# Patient Record
Sex: Female | Born: 1940 | Race: Asian | Hispanic: No | Smoking: Never smoker
Health system: Southern US, Community
[De-identification: ages and names within clinical notes are randomized; demographics above are authoritative.]

---

## 2006-12-01 ENCOUNTER — Telehealth (INDEPENDENT_AMBULATORY_CARE_PROVIDER_SITE_OTHER): Payer: Self-pay | Admitting: Nurse Practitioner

## 2006-12-08 ENCOUNTER — Encounter (INDEPENDENT_AMBULATORY_CARE_PROVIDER_SITE_OTHER): Payer: Self-pay | Admitting: Internal Medicine

## 2006-12-08 ENCOUNTER — Ambulatory Visit: Payer: Self-pay | Admitting: Nurse Practitioner

## 2006-12-08 DIAGNOSIS — R413 Other amnesia: Secondary | ICD-10-CM | POA: Insufficient documentation

## 2006-12-08 DIAGNOSIS — R519 Headache, unspecified: Secondary | ICD-10-CM | POA: Insufficient documentation

## 2006-12-08 DIAGNOSIS — F8189 Other developmental disorders of scholastic skills: Secondary | ICD-10-CM

## 2006-12-08 DIAGNOSIS — R51 Headache: Secondary | ICD-10-CM

## 2006-12-08 LAB — CONVERTED CEMR LAB
AST: 16 units/L (ref 0–37)
Albumin: 4.6 g/dL (ref 3.5–5.2)
Alkaline Phosphatase: 73 units/L (ref 39–117)
Basophils Relative: 0 % (ref 0–1)
Eosinophils Absolute: 0.1 10*3/uL — ABNORMAL LOW (ref 0.2–0.7)
Glucose, Urine, Semiquant: NEGATIVE
HDL: 44 mg/dL (ref >39)
LDL Cholesterol: 84 mg/dL (ref 0–99)
MCHC: 33.1 g/dL (ref 30.0–36.0)
MCV: 85.6 fL (ref 78.0–100.0)
Neutrophils Relative %: 70 % (ref 43–77)
Platelets: 233 10*3/uL (ref 150–400)
Potassium: 3.8 meq/L (ref 3.5–5.3)
Sodium: 145 meq/L (ref 135–145)
TSH: 0.765 microintl units/mL (ref 0.350–5.50)
Total Protein: 7.6 g/dL (ref 6.0–8.3)
Urobilinogen, UA: NEGATIVE
WBC Urine, dipstick: NEGATIVE

## 2006-12-09 ENCOUNTER — Encounter (INDEPENDENT_AMBULATORY_CARE_PROVIDER_SITE_OTHER): Payer: Self-pay | Admitting: Nurse Practitioner

## 2007-03-30 ENCOUNTER — Other Ambulatory Visit: Admission: RE | Admit: 2007-03-30 | Discharge: 2007-03-30 | Payer: Self-pay | Admitting: Family Medicine

## 2007-03-30 ENCOUNTER — Ambulatory Visit: Payer: Self-pay | Admitting: Nurse Practitioner

## 2007-03-30 DIAGNOSIS — F4310 Post-traumatic stress disorder, unspecified: Secondary | ICD-10-CM

## 2007-03-30 DIAGNOSIS — R3129 Other microscopic hematuria: Secondary | ICD-10-CM

## 2007-03-30 LAB — CONVERTED CEMR LAB
Chlamydia, DNA Probe: NEGATIVE
Glucose, Urine, Semiquant: NEGATIVE
Urobilinogen, UA: 0.2
WBC Urine, dipstick: NEGATIVE

## 2007-03-31 ENCOUNTER — Encounter (INDEPENDENT_AMBULATORY_CARE_PROVIDER_SITE_OTHER): Payer: Self-pay | Admitting: Nurse Practitioner

## 2007-04-02 ENCOUNTER — Ambulatory Visit (HOSPITAL_COMMUNITY): Admission: RE | Admit: 2007-04-02 | Discharge: 2007-04-02 | Payer: Self-pay | Admitting: Family Medicine

## 2007-04-30 ENCOUNTER — Ambulatory Visit: Payer: Self-pay | Admitting: Nurse Practitioner

## 2007-05-08 ENCOUNTER — Ambulatory Visit (HOSPITAL_COMMUNITY): Admission: RE | Admit: 2007-05-08 | Discharge: 2007-05-08 | Payer: Self-pay | Admitting: Internal Medicine

## 2008-03-10 ENCOUNTER — Emergency Department (HOSPITAL_COMMUNITY): Admission: EM | Admit: 2008-03-10 | Discharge: 2008-03-10 | Payer: Self-pay | Admitting: Emergency Medicine

## 2008-03-13 ENCOUNTER — Emergency Department (HOSPITAL_COMMUNITY): Admission: EM | Admit: 2008-03-13 | Discharge: 2008-03-13 | Payer: Self-pay | Admitting: Emergency Medicine

## 2010-02-11 ENCOUNTER — Encounter: Payer: Self-pay | Admitting: Family Medicine

## 2010-05-08 LAB — URINALYSIS, ROUTINE W REFLEX MICROSCOPIC
Glucose, UA: 100 mg/dL — AB
Glucose, UA: NEGATIVE mg/dL
Ketones, ur: NEGATIVE mg/dL
Protein, ur: NEGATIVE mg/dL
Protein, ur: NEGATIVE mg/dL
Urobilinogen, UA: 0.2 mg/dL (ref 0.0–1.0)
pH: 7.5 (ref 5.0–8.0)

## 2010-05-08 LAB — CBC
HCT: 37.4 % (ref 36.0–46.0)
Hemoglobin: 12.5 g/dL (ref 12.0–15.0)
Hemoglobin: 13.4 g/dL (ref 12.0–15.0)
MCHC: 33.3 g/dL (ref 30.0–36.0)
MCV: 85 fL (ref 78.0–100.0)
RBC: 4.4 MIL/uL (ref 3.87–5.11)
RBC: 4.78 MIL/uL (ref 3.87–5.11)
RDW: 13.7 % (ref 11.5–15.5)
WBC: 11.9 10*3/uL — ABNORMAL HIGH (ref 4.0–10.5)

## 2010-05-08 LAB — POCT I-STAT, CHEM 8
BUN: 14 mg/dL (ref 6–23)
Calcium, Ion: 1.07 mmol/L — ABNORMAL LOW (ref 1.12–1.32)
Creatinine, Ser: 1.1 mg/dL (ref 0.4–1.2)
Hemoglobin: 14.3 g/dL (ref 12.0–15.0)
Sodium: 139 mEq/L (ref 135–145)
TCO2: 25 mmol/L (ref 0–100)

## 2010-05-08 LAB — COMPREHENSIVE METABOLIC PANEL
ALT: 18 U/L (ref 0–35)
AST: 30 U/L (ref 0–37)
Alkaline Phosphatase: 83 U/L (ref 39–117)
CO2: 26 mEq/L (ref 19–32)
Chloride: 101 mEq/L (ref 96–112)
GFR calc Af Amer: 59 mL/min — ABNORMAL LOW (ref >60)
GFR calc non Af Amer: 49 mL/min — ABNORMAL LOW (ref >60)
Glucose, Bld: 173 mg/dL — ABNORMAL HIGH (ref 70–99)
Potassium: 4.1 mEq/L (ref 3.5–5.1)
Sodium: 136 mEq/L (ref 135–145)

## 2010-05-08 LAB — BASIC METABOLIC PANEL
CO2: 27 mEq/L (ref 19–32)
Chloride: 105 mEq/L (ref 96–112)
GFR calc Af Amer: >60 mL/min (ref >60)
Glucose, Bld: 116 mg/dL — ABNORMAL HIGH (ref 70–99)
Sodium: 138 mEq/L (ref 135–145)

## 2010-05-08 LAB — DIFFERENTIAL
Basophils Absolute: 0 10*3/uL (ref 0.0–0.1)
Basophils Relative: 0 % (ref 0–1)
Basophils Relative: 0 % (ref 0–1)
Eosinophils Absolute: 0 10*3/uL (ref 0.0–0.7)
Eosinophils Absolute: 0.1 10*3/uL (ref 0.0–0.7)
Eosinophils Relative: 0 % (ref 0–5)
Eosinophils Relative: 1 % (ref 0–5)
Monocytes Absolute: 0.6 10*3/uL (ref 0.1–1.0)
Monocytes Relative: 7 % (ref 3–12)
Neutro Abs: 7.5 10*3/uL (ref 1.7–7.7)
Neutrophils Relative %: 92 % — ABNORMAL HIGH (ref 43–77)

## 2010-05-08 LAB — URINE MICROSCOPIC-ADD ON

## 2010-05-08 LAB — LIPASE, BLOOD: Lipase: 19 U/L (ref 11–59)

## 2010-05-11 ENCOUNTER — Emergency Department (HOSPITAL_COMMUNITY)
Admission: EM | Admit: 2010-05-11 | Discharge: 2010-05-11 | Disposition: A | Discharge status: Home / Self Care | Payer: Medicaid Other | Attending: Emergency Medicine | Admitting: Emergency Medicine

## 2010-05-11 DIAGNOSIS — W540XXA Bitten by dog, initial encounter: Secondary | ICD-10-CM | POA: Insufficient documentation

## 2010-05-11 DIAGNOSIS — M79609 Pain in unspecified limb: Secondary | ICD-10-CM | POA: Insufficient documentation

## 2010-05-11 DIAGNOSIS — S81009A Unspecified open wound, unspecified knee, initial encounter: Secondary | ICD-10-CM | POA: Insufficient documentation

## 2010-12-17 ENCOUNTER — Emergency Department (INDEPENDENT_AMBULATORY_CARE_PROVIDER_SITE_OTHER)
Admission: EM | Admit: 2010-12-17 | Discharge: 2010-12-17 | Disposition: A | Discharge status: Home / Self Care | Payer: Medicare Other | Source: Home / Self Care | Attending: Emergency Medicine | Admitting: Emergency Medicine

## 2010-12-17 DIAGNOSIS — H9209 Otalgia, unspecified ear: Secondary | ICD-10-CM

## 2010-12-17 DIAGNOSIS — H9190 Unspecified hearing loss, unspecified ear: Secondary | ICD-10-CM

## 2010-12-17 DIAGNOSIS — H9203 Otalgia, bilateral: Secondary | ICD-10-CM

## 2010-12-17 MED ORDER — CETIRIZINE-PSEUDOEPHEDRINE ER 5-120 MG PO TB12: 1.0000 | ORAL_TABLET | Freq: Every day | ORAL | Status: AC

## 2010-12-17 NOTE — ED Notes (Signed)
3 week hx of bilateral earache.  traveled to [place] 3 weeks ago, then started having ear pain.

## 2010-12-17 NOTE — ED Provider Notes (Signed)
History     CSN: [id number] Arrival date & time: 12/17/2010 10:57 AM   First MD Initiated Contact with Patient 12/17/10 1020      Chief Complaint  Patient presents with  . Otalgia    3 week hx of bilateral earache.  traveled to [place] 3 weeks ago, then started having ear pain.      (Consider location/radiation/quality/duration/timing/severity/associated sxs/prior treatment) HPI Comments: B/l hearing loss x 3 weeks- post-trip to [place] denies cold like symptoms or colds prior and post Cough x 2 days.No fevers. Can't hear out of both ears after 3-4 days since her return".   Patient is a 70 y.o. female presenting with ear pain. The history is provided by the patient.  Otalgia This is a new problem. The current episode started more than 1 week ago. There is pain in both ears. The problem occurs constantly. The problem has not changed since onset.There has been no fever. The patient is experiencing no pain. Associated symptoms include hearing loss. Pertinent negatives include no ear discharge, no headaches, no sore throat, no abdominal pain, no vomiting, no neck pain, no cough and no rash. Her past medical history is significant for hearing loss. Her past medical history does not include chronic ear infection or tympanostomy tube.    History reviewed. No pertinent past medical history.  History reviewed. No pertinent past surgical history.  History reviewed. No pertinent family history.  History  Substance Use Topics  . Smoking status: Never Smoker   . Smokeless tobacco: Never Used  . Alcohol Use: No    OB History    Grav Para Term Preterm Abortions TAB SAB Ect Mult Living                  Review of Systems  HENT: Positive for hearing loss and ear pain. Negative for sore throat, neck pain and ear discharge.   Respiratory: Negative for cough.   Gastrointestinal: Negative for vomiting and abdominal pain.  Skin: Negative for rash.  Neurological: Negative for headaches.     Allergies  Review of patient's allergies indicates no known allergies.  Home Medications  No current outpatient prescriptions on file.  BP 146/85  Pulse 75  Temp(Src) 98.7 F (37.1 C) (Oral)  Resp 16  SpO2 100%  Physical Exam  ED Course  Procedures (including critical care time)  Labs Reviewed - No data to display No results found.   No diagnosis found.    MDM  B/l hearing loss x 3 weeks- opts-trip to [place] denies cold like symptoms or colds prior and post Cough x 2 days.No fevers        Jimmie Molly, MD 12/17/10 [phone]

## 2011-01-01 IMAGING — CR DG ABDOMEN 1V
1 series · 1 of 1 positions shown · non-contrast
Comparison: CT from 03/10/2008

CLINICAL DATA: Flank pain.  Known 3 mm distal left ureteral stone
on the CT scan from 3 days ago.

ABDOMEN - 1 VIEW

[t abdomen supine]
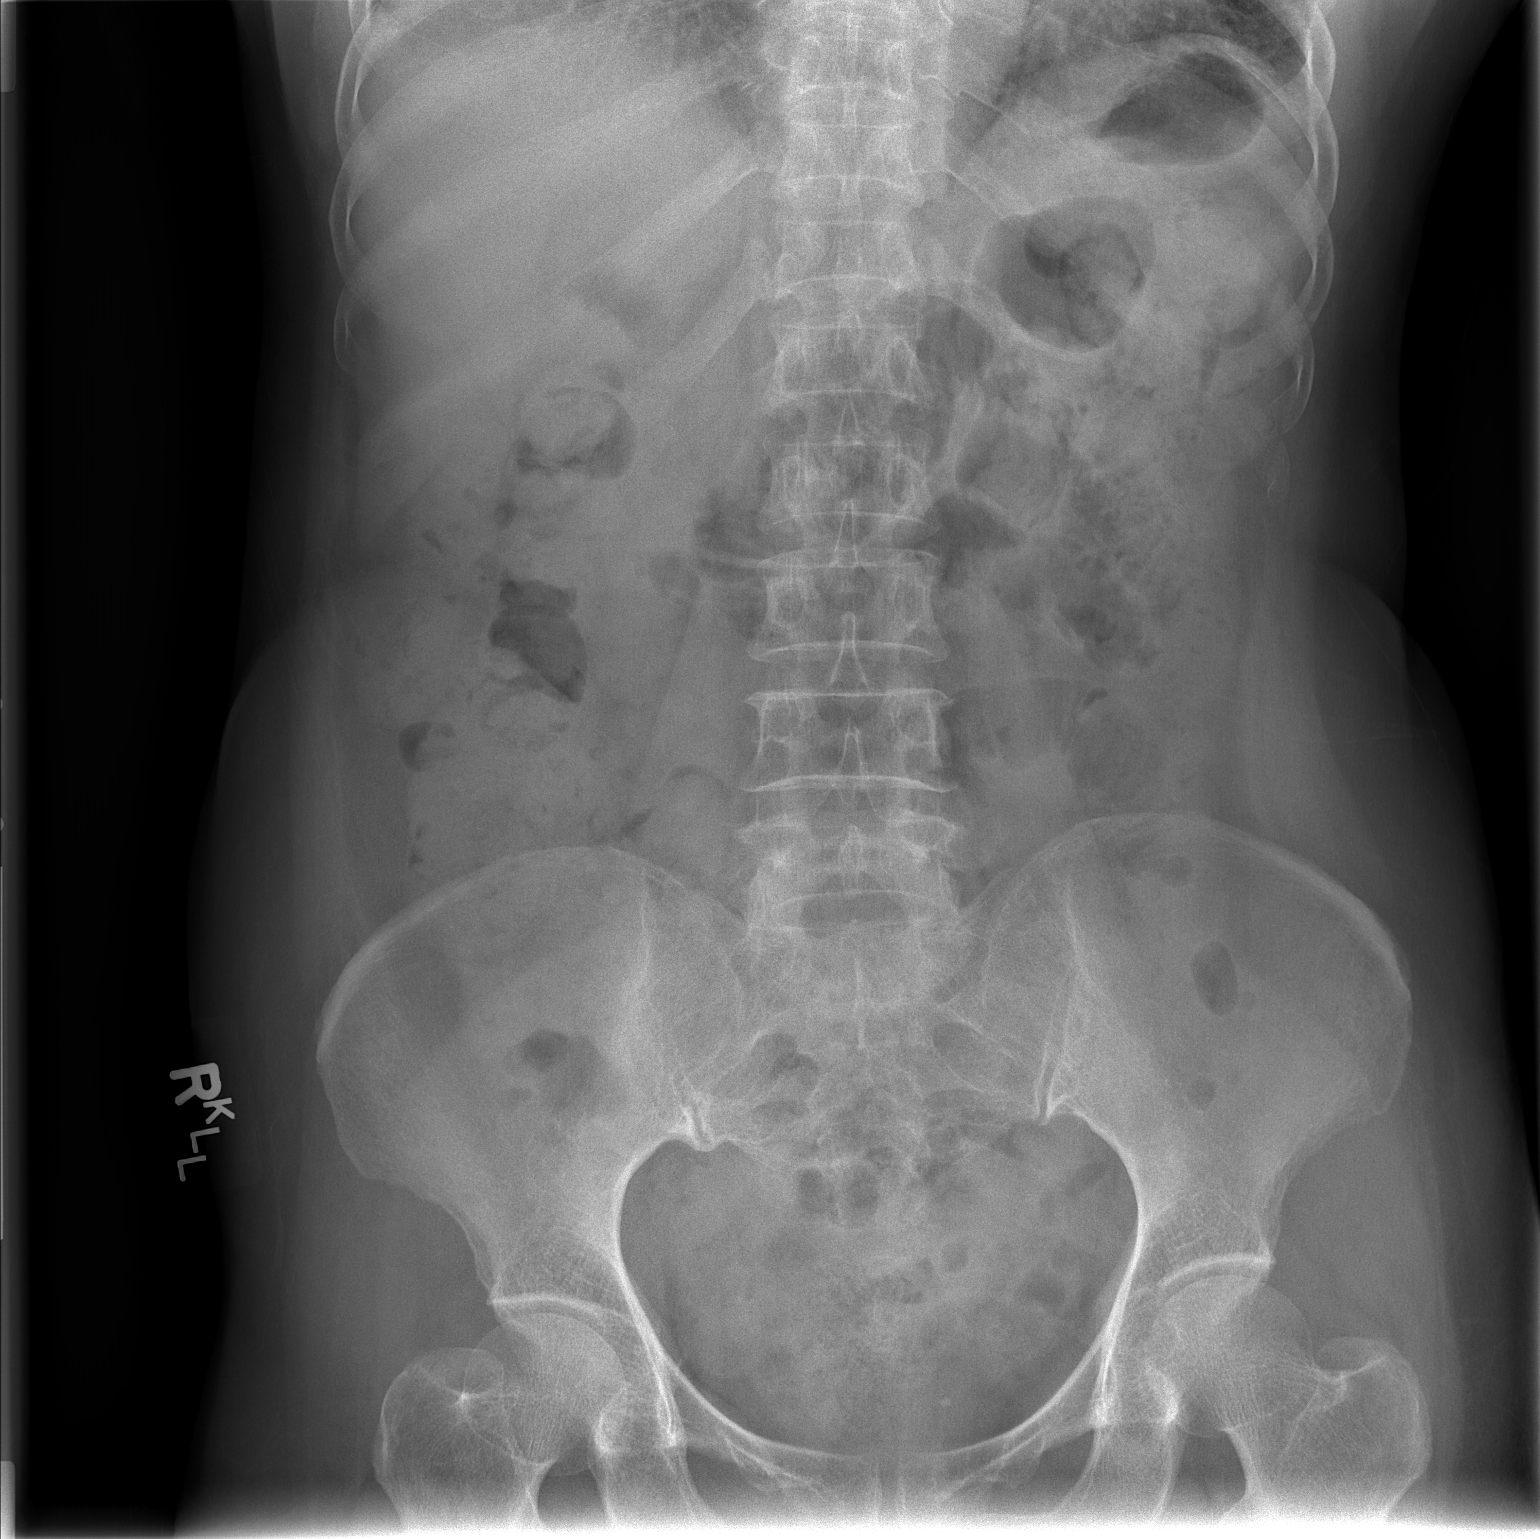

[1 of 1 positions shown; findings below may reference images not displayed]

FINDINGS: The bowel gas pattern is nonspecific.  Bones are
demineralized.  3 mm density in the left inferior anatomic pelvis
is compatible with the distal left ureteral stone as the patient
had no calcified phleboliths within the anatomic pelvis on the
recent CT scan.  The stone remains to the left of midline, and
thus, it is felt to be within or near the left UVJ.
IMPRESSION: Persistence of the distal left ureteral/UVJ stone.

## 2014-05-02 DIAGNOSIS — E119 Type 2 diabetes mellitus without complications: Secondary | ICD-10-CM | POA: Diagnosis not present

## 2014-05-02 DIAGNOSIS — I10 Essential (primary) hypertension: Secondary | ICD-10-CM | POA: Diagnosis not present

## 2016-03-12 ENCOUNTER — Encounter (HOSPITAL_COMMUNITY): Payer: Self-pay | Admitting: Emergency Medicine

## 2016-03-12 ENCOUNTER — Ambulatory Visit (HOSPITAL_COMMUNITY)
Admission: EM | Admit: 2016-03-12 | Discharge: 2016-03-12 | Disposition: A | Discharge status: Home / Self Care | Payer: Medicare Other | Attending: Family Medicine | Admitting: Family Medicine

## 2016-03-12 DIAGNOSIS — J209 Acute bronchitis, unspecified: Secondary | ICD-10-CM

## 2016-03-12 MED ORDER — BENZONATATE 100 MG PO CAPS: 100.0000 mg | ORAL_CAPSULE | Freq: Three times a day (TID) | ORAL | 0 refills | Status: DC

## 2016-03-12 MED ORDER — AZITHROMYCIN 250 MG PO TABS: 250.0000 mg | ORAL_TABLET | Freq: Every day | ORAL | 0 refills | Status: DC

## 2016-03-12 NOTE — Discharge Instructions (Signed)
I am treating you for acute bronchitis. I have prescribed azithromycin. Take 2 tablets today, then 1 tablet daily until finished. For cough and prescribed Tessalon, take one tablet every 8 hours as needed for cough. I would encourage you to continue to take over-the-counter Mucinex, twice a day with a full glass of water. Should your symptoms continue, fail to improve, follow up with your primary care physician or return to clinic as needed.

## 2016-03-12 NOTE — ED Triage Notes (Signed)
The patient presented to the [facility] with a complaint of a cough x 5 days. 

## 2016-03-12 NOTE — ED Provider Notes (Signed)
CSN: [id number]     Arrival date & time 03/12/16  1116 History   First MD Initiated Contact with Patient 03/12/16 1314     Chief Complaint  Patient presents with  . Cough   (Consider location/radiation/quality/duration/timing/severity/associated sxs/prior Treatment) 76 year old female patient presents to clinic with chief complaint of cough, and sore throat, for 5 days. Patient denies any fever, nausea, vomiting, diarrhea, abdominal pain, denies muscle aches, headaches, body aches, or congestion. She does not smoke, denies history of chronic lung disease such as asthma, COPD, or emphysema. She has tried over-the-counter Mucinex, without relief.   The history is provided by a relative. The history is limited by a language barrier. No language interpreter was used.  Cough    History reviewed. No pertinent past medical history. History reviewed. No pertinent surgical history. History reviewed. No pertinent family history. Social History  Substance Use Topics  . Smoking status: Never Smoker  . Smokeless tobacco: Never Used  . Alcohol use No   OB History    No data available     Review of Systems  Reason unable to perform ROS: as covered in HPI.  Respiratory: Positive for cough.   All other systems reviewed and are negative.   Allergies  Patient has no known allergies.  Home Medications   Prior to Admission medications   Medication Sig Start Date End Date Taking? Authorizing Provider  azithromycin (ZITHROMAX) 250 MG tablet Take 1 tablet (250 mg total) by mouth daily. Take first 2 tablets together, then 1 every day until finished. 03/12/16   Dorena BodoLawrence Levan Aloia, NP  benzonatate (TESSALON) 100 MG capsule Take 1 capsule (100 mg total) by mouth every 8 (eight) hours. 03/12/16   Dorena BodoLawrence Keeara Frees, NP   Meds Ordered and Administered this Visit  Medications - No data to display  BP 158/67 (BP Location: Right Arm)   Pulse 92   Temp 97.5 F (36.4 C) (Oral)   Resp 18   SpO2 100%  No  data found.   Physical Exam  Constitutional: She is oriented to person, place, and time. She appears well-developed and well-nourished. She does not have a sickly appearance. She does not appear ill. No distress.  HENT:  Head: Normocephalic and atraumatic.  Right Ear: Tympanic membrane and external ear normal.  Left Ear: Tympanic membrane and external ear normal.  Nose: Nose normal. Right sinus exhibits no maxillary sinus tenderness and no frontal sinus tenderness. Left sinus exhibits no maxillary sinus tenderness and no frontal sinus tenderness.  Mouth/Throat: Uvula is midline and oropharynx is clear and moist. No oropharyngeal exudate.  Eyes: Pupils are equal, round, and reactive to light.  Neck: Normal range of motion. Neck supple. No JVD present.  Cardiovascular: Normal rate and regular rhythm.   Pulmonary/Chest: Effort normal and breath sounds normal. No respiratory distress. She has no wheezes. She has no rhonchi. She has no rales.  Abdominal: Soft. Bowel sounds are normal. She exhibits no distension. There is no tenderness. There is no guarding.  Lymphadenopathy:       Head (right side): No submental, no submandibular and no tonsillar adenopathy present.       Head (left side): No submental, no submandibular and no tonsillar adenopathy present.    She has no cervical adenopathy.  Neurological: She is alert and oriented to person, place, and time.  Skin: Skin is warm and dry. Capillary refill takes less than 2 seconds. She is not diaphoretic.  Psychiatric: She has a normal mood and affect.  Nursing  note and vitals reviewed.   Urgent Care Course     Procedures (including critical care time)  Labs Review Labs Reviewed - No data to display  Imaging Review No results found.   Visual Acuity Review  Right Eye Distance:   Left Eye Distance:   Bilateral Distance:    Right Eye Near:   Left Eye Near:    Bilateral Near:         MDM   1. Acute bronchitis, unspecified  organism   I am treating you for acute bronchitis. I have prescribed azithromycin. Take 2 tablets today, then 1 tablet daily until finished. For cough and prescribed Tessalon, take one tablet every 8 hours as needed for cough. I would encourage you to continue to take over-the-counter Mucinex, twice a day with a full glass of water. Should your symptoms continue, fail to improve, follow up with your primary care physician or return to clinic as needed.     Dorena Bodo, NP 03/12/16 1343

## 2016-05-03 ENCOUNTER — Ambulatory Visit (INDEPENDENT_AMBULATORY_CARE_PROVIDER_SITE_OTHER): Payer: Medicare Other | Admitting: Physician Assistant

## 2016-05-06 ENCOUNTER — Ambulatory Visit (INDEPENDENT_AMBULATORY_CARE_PROVIDER_SITE_OTHER): Payer: Medicare Other | Admitting: Physician Assistant

## 2016-05-13 ENCOUNTER — Ambulatory Visit (INDEPENDENT_AMBULATORY_CARE_PROVIDER_SITE_OTHER): Payer: Medicare Other | Admitting: Physician Assistant

## 2016-05-13 ENCOUNTER — Encounter (INDEPENDENT_AMBULATORY_CARE_PROVIDER_SITE_OTHER): Payer: Self-pay | Admitting: Physician Assistant

## 2016-05-13 VITALS — BP 189/94 | HR 70 | Temp 97.4°F | Ht 58.5 in | Wt 104.0 lb

## 2016-05-13 DIAGNOSIS — I10 Essential (primary) hypertension: Secondary | ICD-10-CM

## 2016-05-13 DIAGNOSIS — Z9189 Other specified personal risk factors, not elsewhere classified: Secondary | ICD-10-CM | POA: Diagnosis not present

## 2016-05-13 DIAGNOSIS — H538 Other visual disturbances: Secondary | ICD-10-CM | POA: Diagnosis not present

## 2016-05-13 DIAGNOSIS — Z1211 Encounter for screening for malignant neoplasm of colon: Secondary | ICD-10-CM

## 2016-05-13 DIAGNOSIS — Z23 Encounter for immunization: Secondary | ICD-10-CM | POA: Diagnosis not present

## 2016-05-13 MED ORDER — CLONIDINE HCL 0.1 MG PO TABS
0.2000 mg | ORAL_TABLET | Freq: Once | ORAL | Status: AC
  Administered 2016-05-13: 0.2 mg via ORAL

## 2016-05-13 MED ORDER — PNEUMOCOCCAL VAC POLYVALENT 25 MCG/0.5ML IJ INJ: 0.5000 mL | INJECTION | INTRAMUSCULAR | Status: DC

## 2016-05-13 MED ORDER — LISINOPRIL-HYDROCHLOROTHIAZIDE 20-12.5 MG PO TABS: 1.0000 | ORAL_TABLET | Freq: Every day | ORAL | 1 refills | Status: DC

## 2016-05-13 MED ORDER — ASPIRIN EC 81 MG PO TBEC: 81.0000 mg | DELAYED_RELEASE_TABLET | Freq: Every day | ORAL | 3 refills | Status: DC

## 2016-05-13 MED ORDER — TETANUS-DIPHTH-ACELL PERTUSSIS 5-2.5-18.5 LF-MCG/0.5 IM SUSP: 0.5000 mL | Freq: Once | INTRAMUSCULAR | Status: DC

## 2016-05-13 NOTE — Patient Instructions (Signed)
T?ng huy?t p Hypertension T?ng huy?t p, th??ng ???c g?i l huy?t p cao, l khi l?c b?m mu qua ??ng m?ch c?a qu v? qu m?nh. ??ng m?ch c?a qu v? l cc m?ch mu mang mu t? tim ?i kh?p c? th?. T?ng huy?t p khi?n tim lm vi?c v?t v? h?n ?? b?m mu v c th? khi?n cc ??ng m?ch tr? ln h?p ho?c c?ng. T?ng huy?t p khng ???c ?i?u tr? ho?c khng ki?m sot ???c c th? d?n t?i nh?i mu c? tim, ??t qu?, b?nh th?n v nh?ng v?n ?? khc. Ch? s? ?o huy?t p g?m m?t ch? s? cao trn m?t ch? s? th?p. Huy?t a?p ly? t???ng cu?a quy? vi? la? d??i 120/80. Ch? s? ??u tin ("??nh") ???c g?i l huy?t p tm thu. ?y l s? ?o p su?t trong ??ng m?ch khi tim qu v? ??p. Ch? s? th? hai ("?y") ???c g?i l huy?t p tm tr??ng. ?y l s? ?o p su?t trong ??ng m?ch khi tim qu v? ngh?. Nguyn nhn g gy ra? Khng r nguyn nhn gy ra tnh tr?ng ny. ?i?u g lm t?ng nguy c?? M?t s? y?u t? nguy c? d?n ??n huy?t p cao c th? ki?m sot ???c. M?t s? y?u t? khc th khng. Nh?ng y?u t? qu v? c th? thay ??i   Ht thu?c.  B? b?nh ti?u ???ng tup 2, cholesterol cao, ho?c c? hai.  Khng t?p th? d?c ho?c cc ho?t ??ng th? ch?t ??y ??.  Th?a cn.  ?n qu nhi?u ch?t bo, ???ng, ca-lo, ho?c mu?i (Natri).  U?ng qu nhi?u r??u. Nh?ng y?u t? kh ho?c khng th? thay ??i   B?nh th?n m?n tnh.  C ti?n s? gia ?nh b? cao huy?t p.  ?? tu?i. Nguy c? t?ng ln theo ?? tu?i.  Ch?ng t?c. Qu v? c th? c nguy c? cao h?n n?u qu v? l ng??i M? g?c Phi.  Gi?i tnh. Nam gi?i c nguy c? cao h?n ph? n? tr??c tu?i 45. Sau tu?i 65, ph? n? c nguy c? cao h?n nam gi?i.  Ng?ng th? do t?c ngh?n khi ng?.  C?ng th?ng. Cc d?u hi?u ho?c tri?u ch?ng l g? Huy?t p qu cao (c?n t?ng huy?t p) c th? gy ra:  ?au ??u.  Lo u.  Kh th?.  Ch?y mu cam.  Bu?n nn v nn.  ?au ng?c n?ng.  C? ??ng gi?t gi?t qu v? khng th? ki?m sot ???c (co gi?t). Ch?n ?on tnh tr?ng ny nh? th? no? Tnh tr?ng ny ???c ch?n ?on b?ng  cch ?o huy?t p c?a qu v? lc qu v? ng?i, ?? tay trn m?t m?t ph?ng. B?ng qu?n thi?t b? ?o huy?t p s? ???c qu?n tr?c ti?pvo vng da cnh tay pha trn c?a qu v? ngang v?i m?c tim. Huy?t p c?n ???c ?o t nh?t hai l?n trn cng m?t cnh tay. M?t s? tnh tr?ng nh?t ??nh c th? lm cho huy?t p khc nhau gi?a tay ph?i v tay tri c?a qu v?. M?t s? y?u t? nh?t ??nh c th? khi?n ch? s? ?o huy?t p th?p h?n ho?c cao h?n so v?i bnh th??ng (t?ng) trong th?i gian ng?n:  Khi huy?t p c?a qu v? ? phng khm c?a chuyn gia ch?m sc s?c kh?e cao h?n so v?i lc qu v? ? nh, hi?n t??ng ny ???c g?i l t?ng huy?t p o chong tr?ng. H?u h?t nh?ng ng??i b? tnh tr?ng ny ??u khng c?n dng thu?c.  Khi   huy?t p c?a qu v? lc ? nh cao h?n so v?i lc qu v? ? phng khm chuyn gia ch?m sc s?c kh?e, hi?n t??ng ny ???c g?i l t?ng huy?t p m?t n?. H?u h?t nh?ng ng??i b? tnh tr?ng ny ??u c th? c?n dng thu?c ?? ki?m sot huy?t p. N?u qu v? c ch? s? huy?t p cao trong m?t l?n khm ho?c qu v? c huy?t p bnh th??ng c km cc y?u t? nguy c? khc:  Qu v? c th? ???c yu c?u tr? l?i vo m?t ngy khc ?? ki?m tra l?i huy?t p.  Qu v? c th? ???c yu c?u theo di huy?t p t?i nh trong vng 1 tu?n ho?c lu h?n. N?u qu v? ???c ch?n ?on b? t?ng huy?t p, qu v? c th? c?n th?c hi?n cc xt nghi?m mu ho?c ki?m tra hnh ?nh khc ?? gip chuyn gia ch?m sc s?c kh?e hi?u nguy c? t?ng th? m?c cc b?nh tr?ng khc. Tnh tr?ng ny ???c ?i?u tr? nh? th? no? Tnh tr?ng ny ???c ?i?u tr? b?ng cch thay ??i l?i s?ng lnh m?nh, ch?ng h?nh nh? ?n th?c ph?m c l?i cho s?c kh?e, t?p th? d?c nhi?u h?n v gi?m l??ng r??u u?ng vo. N?u thay ??i l?i s?ng khng ?? ?? ??a huy?t p v? m?c c th? ki?m sot ???c, chuyn gia ch?m sc s?c kh?e c th? k ??n thu?c, v n?u:  Huy?t p tm thu c?a qu v? trn 130.  Huy?t p tm tr??ng c?a qu v? trn 80. Huy?t p m?c tiu c nhn c?a qu v? c th? khc nhau ty thu?c v tnh tr?ng  b?nh l, tu?i v cc nhn t? khc. Tun th? nh?ng h??ng d?n ny ? nh: ?n v u?ng   ?n ch? ?? giu ch?t x? v kali v t natri, ???ng ph? gia v ch?t bo. M?t k? ho?ch ?n m?u c tn ch? ?? ?n DASH (Cch ti?p c?n ?n u?ng ?? gi?m t?ng huy?t p). ?n theo cch ny:  ?n nhi?u tri cy v rau t??i. Vo m?i b?a ?n, c? g?ng dnh m?t n?a ??a cho tri cy v rau.  ?n ng? c?c nguyn h?t, ch?ng h?n nh? m ?ng lm t? b?t m nguyn cm, ho?c bnh m nguyn h?t. Cho ngu? c?c nguyn ca?m va?o m?t ph?n t? ??a c?a quy? vi?.  ?n ho?c hu?ng cc s?n ph?m t? s?a t bo, ch?ng h?n nh? s?a ? b? kem ho?c s?a chua t bo.  Trnh nh?ng mi?ng th?t nhi?u m?, th?t ? qua ch? bi?n ho?c th?t ??p mu?i v th?t gia c?m c da. Dnh kho?ng m?t ph?n t? ??a c?a qu v? cho cc protein khng m?, ch?ng h?n nh? c, th?t g khng da, ??u, tr?ng, v ??u ph?.  Trnh nh?ng th?c ph?m ch? bi?n ho?c lm s?n. Nh?ng th?c ph?m ny th??ng c nhi?u natri, ???ng ph? gia v ch?t bo h?n.  Gi?m l??ng dng natri hng ngy c?a qu v?. H?u h?t nh?ng ng??i b? t?ng huy?t p ??u nn ?n d??i 1.500 mg natri m?i ngy.  Gi?i h?n l??ng r??u qu v? u?ng khng qu 1 ly m?i ngy v?i ph? n? khng mang thai v 2 ly m?i ngy v?i nam gi?i. M?t ly t??ng ???ng v?i 12 ao-x? bia, 5 ao-x? r??u vang, ho?c 1 ao-x? r??u m?nh. L?i s?ng   H?p tc v?i chuyn gia ch?m sc s?c kh?e c?a qu v? ?? duy tr tr?ng l??ng c? th? c l?i cho   s?c kh?e ho?c gi?m cn. Hy h?i xem tr?ng l??ng no l l t??ng cho qu v?.  Dnh t nh?t 30 pht ?? t?p th? d?c m c th? khi?n tim qu v? ??p nhanh h?n (t?p th? d?c nh?p ?i?u) h?u h?t cc ngy trong tu?n. Cc ho?t ??ng c th? bao g?m ?i b?, b?i, ho?c ??p xe.  Bao g?m bi t?p t?ng c??ng c? (bi t?p khng l?c), ch?ng h?n nh? bi t?p Pilates ho?c nng t?, nh? m?t ph?n c?a thi quen luy?n t?p hng tu?n c?a qu v?. C? g?ng t?p nh?ng lo?i bi t?p ny trong vng 30 pht t?i thi?u 3 ngy m?t tu?n.  Khng s? d?ng b?t k? s?n ph?m no ch?a nicotine ho?c  thu?c l, ch?ng ha?n nh? thu?c l d?ng ht v thu?c l ?i?n t?. N?u qu v? c?n gip ?? ?? cai thu?c, hy h?i chuyn gia ch?m sc s?c kh?e.  Theo di huy?t p c?a qu v? t?i nh theo h??ng d?n c?a chuyn gia ch?m sc s?c kh?e.  Tun th? t?t c? cc cu?c h?n khm l?i theo ch? d?n c?a chuyn gia ch?m sc s?c kh?e. ?i?u ny c vai tr quan tr?ng. Thu?c   Ch? s? d?ng thu?c khng k ??n v thu?c k ??n theo ch? d?n c?a chuyn gia ch?m sc s?c kh?e. Lm theo ch? d?n m?t cch c?n th?n. Thu?c ?i?u tr? huy?t p ph?i ???c dng theo ??n ? k.  Khng b? li?u thu?c huy?t p. B? li?u khi?n qu v? c nguy c? g?p ph?i cc v?n ?? v c th? lm cho thu?c gi?m hi?u qu?.  Hy h?i chuyn gia ch?m sc s?c kh?e c?a qu v? v? nh?ng tc d?ng ph? ho?c ph?n ?ng v?i thu?c m qu v? ph?i theo di. Hy lin l?c v?i chuyn gia ch?m sc s?c kh?e n?u:  Qu v? ngh? qu v? c ph?n ?ng v?i thu?c ?ang dng.  Qu v? b? ?au ??u ti?p t?c tr? l?i (ti pht).  Qu v? c?m th?y chng m?t.  Qu v? b? s?ng ph ? m?t c chn.  Qu v? c v?n ?? v? th? l?c. Yu c?u tr? gip ngay l?p t?c n?u:  Qu v? b? ?au ??u n?ng ho?c l l?n.  Qu v? b? y?u b?t th??ng ho?c t b.  Quy? vi? ca?m th?y bi? ng?t.  Qu v? b? ?au r?t nhi?u ? ng?c ho?c b?ng.  Qu v? nn nhi?u l?n.  Qu v? b? kh th?. Tm t?t  T?ng huy?t p l khi l?c b?m mu qua ??ng m?ch c?a qu v? qu m?nh. N?u tnh tr?ng ny khng ???c ki?m sot, n c th? khi?n qu v? g?p ph?i nguy c? bi?n ch?ng nghim tr?ng.  Huy?t p m?c tiu c nhn c?a qu v? c th? khc nhau ty thu?c v tnh tr?ng b?nh l, tu?i v cc nhn t? khc. ??i v?i h?u h?t m?i ng??i, huy?t p bnh th??ng l d??i 120/80.  ?i?u tr? t?ng huy?t p b?ng cch thay ??i l?i s?ng, dng thu?c, ho?c k?t h?p c? hai. Thay ??i l?i s?ng bao g?m gi?m cn, ?n ch? ?? ?n c l?i cho s?c kh?e, t mu?i, t?p th? d?c nhi?u h?n v h?n ch? u?ng r??u. Thng tin ny khng nh?m m?c ?ch thay th? cho l?i khuyn m chuyn gia ch?m sc s?c  kh?e ni v?i qu v?. Hy b?o ??m qu v? ph?i th?o lu?n b?t k? v?n ?? g m qu v? c v?i chuyn gia ch?m sc   s?c kh?e c?a qu v?. Document Released: 01/07/2005 Document Revised: 12/20/2015 Document Reviewed: 12/20/2015 Elsevier Interactive Patient Education  2017 [employer]  

## 2016-05-13 NOTE — Progress Notes (Signed)
Subjective:  Patient ID: Gwendolyn Medina, female    DOB: [birth year]  Age: 76 y.o. MRN: [id number]  CC: vision blurry  HPI Gwendolyn Medina is a 76 y.o. female with a PMH of HTN presents with visual blurring. Onset of visual disturbance 4 years. Feels that vision is worsening bilaterally. Has some photobphobia, blurring, seeing "foggy". Denies polyuria, polydipsia, fatigue, or hx of DM.    She is a hypertensive but has not taken her anti-hypertensives for two years. Endorses mild occasional HA that is relieved with tylenol. Denies any other symptoms. Coordination good and does not believe she is a fall risk.     ROS Review of Systems  Constitutional: Negative for chills, fever and malaise/fatigue.  Eyes: Positive for blurred vision and photophobia. Negative for pain.  Respiratory: Negative for shortness of breath.   Cardiovascular: Negative for chest pain and palpitations.  Gastrointestinal: Negative for abdominal pain and nausea.  Genitourinary: Negative for dysuria and hematuria.  Musculoskeletal: Negative for joint pain and myalgias.  Skin: Negative for rash.  Neurological: Negative for tingling and headaches.  Endo/Heme/Allergies: Negative for polydipsia.  Psychiatric/Behavioral: Negative for depression. The patient is not nervous/anxious.     Objective:  BP (!) 189/94 (BP Location: Right Arm, Patient Position: Sitting, Cuff Size: Small)   Pulse 70   Temp 97.4 F (36.3 C) (Oral)   Ht 4' 10.5" (1.486 m)   Wt 104 lb (47.2 kg)   SpO2 95%   BMI 21.37 kg/m   BP/Weight 05/13/2016 03/12/2016 12/17/2010  Systolic BP 189 158 146  Diastolic BP 94 67 85  Wt. (Lbs) 104 - -  BMI 21.37 - -      Physical Exam  Constitutional: She is oriented to person, place, and time.  Well developed, well nourished, NAD, polite  HENT:  Head: Normocephalic and atraumatic.  Eyes: No scleral icterus.  Arcus senilis. Red reflex intact bilaterally.  Neck: Normal range of motion. Neck supple. No thyromegaly  present.  Cardiovascular: Normal rate, regular rhythm and normal heart sounds.   Pulmonary/Chest: Effort normal and breath sounds normal.  Abdominal: Soft. Bowel sounds are normal. There is no tenderness.  Musculoskeletal: She exhibits no edema.  Neurological: She is alert and oriented to person, place, and time. No cranial nerve deficit. Coordination normal.  Skin: Skin is warm and dry. No rash noted. No erythema. No pallor.  Psychiatric: She has a normal mood and affect. Her behavior is normal. Thought content normal.  Vitals reviewed.    Assessment & Plan:   1. Hypertension, unspecified type - cloNIDine (CATAPRES) tablet 0.2 mg; Take 2 tablets (0.2 mg total) by mouth once. - CBC with Differential/Platelet - Comprehensive metabolic panel - Lipid panel - aspirin EC 81 MG tablet; Take 1 tablet (81 mg total) by mouth daily.  Dispense: 90 tablet; Refill: 3 - lisinopril-hydrochlorothiazide (ZESTORETIC) 20-12.5 MG tablet; Take 1 tablet by mouth daily.  Dispense: 90 tablet; Refill: 1  2. Blurring of visual image - Ambulatory referral to Ophthalmology  3. Encounter for screening colonoscopy - Ambulatory referral to Gastroenterology  4. Need for Tdap - Tdap administered in clinic today  5. Need for Pneumococcal vaccine - Pneumovax administered in clinic today    Meds ordered this encounter  Medications  . cloNIDine (CATAPRES) tablet 0.2 mg  . aspirin EC 81 MG tablet    Sig: Take 1 tablet (81 mg total) by mouth daily.    Dispense:  90 tablet    Refill:  3    Order Specific  Question:   Supervising Provider    Answer:   Quentin Angst [[id number]]  . lisinopril-hydrochlorothiazide (ZESTORETIC) 20-12.5 MG tablet    Sig: Take 1 tablet by mouth daily.    Dispense:  90 tablet    Refill:  1    Order Specific Question:   Supervising Provider    Answer:   Quentin Angst [id number]    Follow-up: Return in about 4 weeks (around 06/10/2016) for HTN.   Loletta Specter  PA

## 2016-05-14 LAB — CBC WITH DIFFERENTIAL/PLATELET
BASOS ABS: 0 10*3/uL (ref 0.0–0.2)
Basos: 0 %
EOS (ABSOLUTE): 0.1 10*3/uL (ref 0.0–0.4)
Eos: 1 %
Hematocrit: 37.3 % (ref 34.0–46.6)
Hemoglobin: 12 g/dL (ref 11.1–15.9)
Immature Grans (Abs): 0 10*3/uL (ref 0.0–0.1)
Immature Granulocytes: 0 %
LYMPHS ABS: 1.5 10*3/uL (ref 0.7–3.1)
LYMPHS: 26 %
MCH: 26.8 pg (ref 26.6–33.0)
MCHC: 32.2 g/dL (ref 31.5–35.7)
MCV: 83 fL (ref 79–97)
Monocytes Absolute: 0.5 10*3/uL (ref 0.1–0.9)
Monocytes: 8 %
NEUTROS ABS: 3.8 10*3/uL (ref 1.4–7.0)
Neutrophils: 65 %
PLATELETS: 247 10*3/uL (ref 150–379)
RBC: 4.47 x10E6/uL (ref 3.77–5.28)
RDW: 14.9 % (ref 12.3–15.4)
WBC: 5.9 10*3/uL (ref 3.4–10.8)

## 2016-05-14 LAB — COMPREHENSIVE METABOLIC PANEL
ALK PHOS: 71 IU/L (ref 39–117)
ALT: 11 IU/L (ref 0–32)
AST: 14 IU/L (ref 0–40)
Albumin/Globulin Ratio: 1.6 (ref 1.2–2.2)
Albumin: 4.1 g/dL (ref 3.5–4.8)
BILIRUBIN TOTAL: 0.4 mg/dL (ref 0.0–1.2)
BUN / CREAT RATIO: 16 (ref 12–28)
BUN: 12 mg/dL (ref 8–27)
CHLORIDE: 104 mmol/L (ref 96–106)
CO2: 28 mmol/L (ref 18–29)
Calcium: 9 mg/dL (ref 8.7–10.3)
Creatinine, Ser: 0.74 mg/dL (ref 0.57–1.00)
GFR calc Af Amer: 92 mL/min/{1.73_m2} (ref >59)
GFR calc non Af Amer: 80 mL/min/{1.73_m2} (ref >59)
GLUCOSE: 84 mg/dL (ref 65–99)
Globulin, Total: 2.5 g/dL (ref 1.5–4.5)
Potassium: 4.3 mmol/L (ref 3.5–5.2)
Sodium: 142 mmol/L (ref 134–144)
Total Protein: 6.6 g/dL (ref 6.0–8.5)

## 2016-05-14 LAB — LIPID PANEL
CHOLESTEROL TOTAL: 180 mg/dL (ref 100–199)
Chol/HDL Ratio: 4.7 ratio — ABNORMAL HIGH (ref 0.0–4.4)
HDL: 38 mg/dL — ABNORMAL LOW (ref >39)
LDL CALC: 101 mg/dL — AB (ref 0–99)
TRIGLYCERIDES: 205 mg/dL — AB (ref 0–149)
VLDL Cholesterol Cal: 41 mg/dL — ABNORMAL HIGH (ref 5–40)

## 2016-06-10 ENCOUNTER — Ambulatory Visit (INDEPENDENT_AMBULATORY_CARE_PROVIDER_SITE_OTHER): Payer: Medicare Other | Admitting: Physician Assistant

## 2017-06-11 ENCOUNTER — Ambulatory Visit (HOSPITAL_COMMUNITY)
Admission: EM | Admit: 2017-06-11 | Discharge: 2017-06-11 | Disposition: A | Discharge status: Home / Self Care | Payer: Medicare Other | Attending: Family Medicine | Admitting: Family Medicine

## 2017-06-11 ENCOUNTER — Encounter (HOSPITAL_COMMUNITY): Payer: Self-pay | Admitting: Emergency Medicine

## 2017-06-11 DIAGNOSIS — L0291 Cutaneous abscess, unspecified: Secondary | ICD-10-CM | POA: Diagnosis not present

## 2017-06-11 MED ORDER — DOXYCYCLINE HYCLATE 100 MG PO CAPS: 100.0000 mg | ORAL_CAPSULE | Freq: Two times a day (BID) | ORAL | 0 refills | Status: DC

## 2017-06-11 NOTE — Discharge Instructions (Addendum)
Keep area clean and dry Return in 2 days to have packing removed Doxycycline prescribed.  Take as directed and to completion Use OTC ibuprofen and/or tylenol as needed for pain Return sooner or go to ER if you have any new or worsening symptoms

## 2017-06-11 NOTE — ED Triage Notes (Signed)
Pt c/o abscess on the back of her neck x1 week.

## 2017-06-11 NOTE — ED Provider Notes (Addendum)
[facility]   [id number] 06/11/17 Arrival Time: 1325   SUBJECTIVE:  Gwendolyn Medina is a 77 y.o. female who presents with a possible abscess of her back of neck. Onset gradual, approximately 1 week ago. Complains of swelling, erythema, discharge, and pain.  Denies fever, chills, nausea, vomiting.    ROS: As per HPI.  History reviewed. No pertinent past medical history. History reviewed. No pertinent surgical history. No Known Allergies No current facility-administered medications on file prior to encounter.    Current Outpatient Medications on File Prior to Encounter  Medication Sig Dispense Refill  . aspirin EC 81 MG tablet Take 1 tablet (81 mg total) by mouth daily. 90 tablet 3  . lisinopril-hydrochlorothiazide (ZESTORETIC) 20-12.5 MG tablet Take 1 tablet by mouth daily. (Patient not taking: Reported on 06/11/2017) 90 tablet 1   Social History   Socioeconomic History  . Marital status: Widowed    Spouse name: Not on file  . Number of children: Not on file  . Years of education: Not on file  . Highest education level: Not on file  Occupational History  . Not on file  Social Needs  . Financial resource strain: Not on file  . Food insecurity:    Worry: Not on file    Inability: Not on file  . Transportation needs:    Medical: Not on file    Non-medical: Not on file  Tobacco Use  . Smoking status: Never Smoker  . Smokeless tobacco: Never Used  Substance and Sexual Activity  . Alcohol use: No  . Drug use: No  . Sexual activity: Never  Lifestyle  . Physical activity:    Days per week: Not on file    Minutes per session: Not on file  . Stress: Not on file  Relationships  . Social connections:    Talks on phone: Not on file    Gets together: Not on file    Attends religious service: Not on file    Active member of club or organization: Not on file    Attends meetings of clubs or organizations: Not on file    Relationship status: Not on file  . Intimate  partner violence:    Fear of current or ex partner: Not on file    Emotionally abused: Not on file    Physically abused: Not on file    Forced sexual activity: Not on file  Other Topics Concern  . Not on file  Social History Narrative  . Not on file   No family history on file.  OBJECTIVE:  Vitals:   06/11/17 1428  BP: (!) 183/94  Pulse: (!) 105  Resp: 18  Temp: 98.3 F (36.8 C)  SpO2: 100%     General appearance: alert; no distress Skin: approximately 5-6 cm area of induration of her posterior left side of neck; tender to touch; minimal active drainage Psychological: alert and cooperative; normal mood and affect    Procedure: Verbal consent obtained. Area over induration cleaned with betadine. Lidocaine 2% with epinephrine used to obtain local anesthesia. The most fluctuant portion of the abscess was incised with a #11 blade scalpel. Abscess cavity explored and evacuated. Loculations broken up with a curved hemostat as best as possible given patient discomfort. Cavity packed with packing material and dressed with a clean gauze dressing. Minimal bleeding. No complications.  ASSESSMENT & PLAN:  1. Abscess     Meds ordered this encounter  Medications  . doxycycline (VIBRAMYCIN) 100 MG capsule  Sig: Take 1 capsule (100 mg total) by mouth 2 (two) times daily.    Dispense:  20 capsule    Refill:  0    Order Specific Question:   Supervising Provider    Answer:   Isa Rankin [[id number]]     Keep area clean and dry Return in 2 days to have packing removed Doxycycline prescribed.  Take as directed and to completion Use OTC ibuprofen and/or tylenol as needed for pain Return sooner or go to ER if you have any new or worsening symptoms  Reviewed expectations re: course of current medical issues. Questions answered. Outlined signs and symptoms indicating need for more acute intervention. Patient verbalized understanding. After Visit Summary given.            Rennis Harding, PA-C 06/11/17 1629    Alvino Chapel [place], PA-C 06/11/17 1630

## 2017-06-13 ENCOUNTER — Encounter (HOSPITAL_COMMUNITY): Payer: Self-pay

## 2017-06-13 ENCOUNTER — Ambulatory Visit (HOSPITAL_COMMUNITY)
Admission: EM | Admit: 2017-06-13 | Discharge: 2017-06-13 | Disposition: A | Discharge status: Home / Self Care | Payer: Medicare Other | Attending: Family Medicine | Admitting: Family Medicine

## 2017-06-13 DIAGNOSIS — L0211 Cutaneous abscess of neck: Secondary | ICD-10-CM | POA: Diagnosis not present

## 2017-06-13 DIAGNOSIS — L0291 Cutaneous abscess, unspecified: Secondary | ICD-10-CM

## 2017-06-13 MED ORDER — KETOROLAC TROMETHAMINE 30 MG/ML IJ SOLN
30.0000 mg | Freq: Once | INTRAMUSCULAR | Status: AC
  Administered 2017-06-13: 30 mg via INTRAMUSCULAR

## 2017-06-13 MED ORDER — KETOROLAC TROMETHAMINE 30 MG/ML IJ SOLN
INTRAMUSCULAR | Status: AC
  Filled 2017-06-13: qty 1

## 2017-06-13 NOTE — ED Provider Notes (Signed)
[facility]   [id number] 06/13/17 Arrival Time: 1042   SUBJECTIVE:  Gwendolyn Medina is a 77 y.o. female who presents follow up regarding packing removal following incision and drainage done on 06/11/17.  Prescribed doxycycline.  Reports improvement in symptoms.  Denies fever, chills, nausea, vomiting, redness, swelling, streaking, or purulent drainage from site.   ROS: As per HPI.  History reviewed. No pertinent past medical history. History reviewed. No pertinent surgical history. No Known Allergies No current facility-administered medications on file prior to encounter.    Current Outpatient Medications on File Prior to Encounter  Medication Sig Dispense Refill  . aspirin EC 81 MG tablet Take 1 tablet (81 mg total) by mouth daily. 90 tablet 3  . doxycycline (VIBRAMYCIN) 100 MG capsule Take 1 capsule (100 mg total) by mouth 2 (two) times daily. 20 capsule 0  . lisinopril-hydrochlorothiazide (ZESTORETIC) 20-12.5 MG tablet Take 1 tablet by mouth daily. (Patient not taking: Reported on 06/11/2017) 90 tablet 1   Social History   Socioeconomic History  . Marital status: Widowed    Spouse name: Not on file  . Number of children: Not on file  . Years of education: Not on file  . Highest education level: Not on file  Occupational History  . Not on file  Social Needs  . Financial resource strain: Not on file  . Food insecurity:    Worry: Not on file    Inability: Not on file  . Transportation needs:    Medical: Not on file    Non-medical: Not on file  Tobacco Use  . Smoking status: Never Smoker  . Smokeless tobacco: Never Used  Substance and Sexual Activity  . Alcohol use: No  . Drug use: No  . Sexual activity: Never  Lifestyle  . Physical activity:    Days per week: Not on file    Minutes per session: Not on file  . Stress: Not on file  Relationships  . Social connections:    Talks on phone: Not on file    Gets together: Not on file    Attends religious service:  Not on file    Active member of club or organization: Not on file    Attends meetings of clubs or organizations: Not on file    Relationship status: Not on file  . Intimate partner violence:    Fear of current or ex partner: Not on file    Emotionally abused: Not on file    Physically abused: Not on file    Forced sexual activity: Not on file  Other Topics Concern  . Not on file  Social History Narrative  . Not on file   History reviewed. No pertinent family history.  OBJECTIVE:  Vitals:   06/13/17 1114  BP: (!) 166/76  Pulse: 99  Resp: 18  Temp: 98.5 F (36.9 C)  SpO2: 100%     General appearance: alert; no distress Skin: Approximately 1 cm open incision on posterior left side of neck with scant purulent discharge; mild surrounding erythema and induration; possible fluctuance at lateral aspect of incision site; minimal tenderness to palpation Psychological: alert and cooperative; normal mood and affect  Procedure: Verbal consent obtained. Area over induration cleaned with betadine. Lidocaine 2% without epinephrine used to obtain local anesthesia. Attempted to aspirate fluctuance area with 18 gauge needle and syringe. No fluid aspirated.  Eschar removed with forceps.  Bacitracin ointment placed.  Dressed with a clean gauze dressing. Minimal bleeding. No complications.  ASSESSMENT &  PLAN:  1. Abscess     Meds ordered this encounter  Medications  . ketorolac (TORADOL) 30 MG/ML injection 30 mg     Continue to take antibiotic as prescribed Wash daily with warm water and mild soap Change dressing daily Perform warm compresses at least 3 times daily for 10-15 minutes Follow up with PCP if symptoms persists Return or go to the ER if you have any new or worsening symptoms such as fever, chills, nausea, vomiting, increased pain, redness, drainage, etc...   Reviewed expectations re: course of current medical issues. Questions answered. Outlined signs and symptoms  indicating need for more acute intervention. Patient verbalized understanding. After Visit Summary given.          Rennis Harding, PA-C 06/13/17 1246

## 2017-06-13 NOTE — Discharge Instructions (Addendum)
Continue to take antibiotic as prescribed Wash daily with warm water and mild soap Change dressing daily Perform warm compresses at least 3 times daily for 10-15 minutes Follow up with PCP if symptoms persists Return or go to the ER if you have any new or worsening symptoms such as fever, chills, nausea, vomiting, increased pain, redness, drainage, etc...   Ti?p t?c dng khng sinh theo quy ??nh R?a hng ngy b?ng n??c ?m v x phng nh? Thay qu?n o hng ngy Th?c hi?n nn ?m t nh?t 3 l?n m?i ngy trong vng 10 - 15 pht Theo di v?i PCP n?u cc tri?u ch?ng v?n cn Quay tr? l?i ho?c ?i ??n phng c?p c?u n?u b?n c b?t k? tri?u ch?ng m?i ho?c x?u ?i nh? s?t, ?n l?nh, bu?n nn, nn, ?au t?ng, ??, ti?t d?ch, v.v ...

## 2017-06-13 NOTE — ED Triage Notes (Signed)
Pt presents to have packing taken out of abscess that was placed two days ago on her neck. Denies any new symptoms.

## 2019-06-19 DIAGNOSIS — Z23 Encounter for immunization: Secondary | ICD-10-CM | POA: Diagnosis not present

## 2019-07-10 DIAGNOSIS — Z23 Encounter for immunization: Secondary | ICD-10-CM | POA: Diagnosis not present

## 2023-02-19 ENCOUNTER — Encounter: Payer: Self-pay | Admitting: Family Medicine

## 2023-02-19 ENCOUNTER — Ambulatory Visit: Payer: Medicare Other | Admitting: Family Medicine

## 2023-02-19 ENCOUNTER — Ambulatory Visit (INDEPENDENT_AMBULATORY_CARE_PROVIDER_SITE_OTHER): Payer: Medicare Other | Admitting: Family Medicine

## 2023-02-19 VITALS — BP 173/90 | HR 79 | Resp 18 | Ht 58.47 in | Wt 94.6 lb

## 2023-02-19 DIAGNOSIS — I10 Essential (primary) hypertension: Secondary | ICD-10-CM

## 2023-02-19 DIAGNOSIS — N182 Chronic kidney disease, stage 2 (mild): Secondary | ICD-10-CM | POA: Diagnosis not present

## 2023-02-19 DIAGNOSIS — Z780 Asymptomatic menopausal state: Secondary | ICD-10-CM

## 2023-02-19 DIAGNOSIS — Z13820 Encounter for screening for osteoporosis: Secondary | ICD-10-CM | POA: Insufficient documentation

## 2023-02-19 DIAGNOSIS — Z7689 Persons encountering health services in other specified circumstances: Secondary | ICD-10-CM | POA: Diagnosis not present

## 2023-02-19 MED ORDER — LISINOPRIL 10 MG PO TABS: 10.0000 mg | ORAL_TABLET | Freq: Every day | ORAL | 1 refills | Status: AC

## 2023-02-19 NOTE — Assessment & Plan Note (Signed)
No history of bone density scan. Given age, assessing bone health is important to prevent fractures. Patient expressed interest in having the scan done after returning from a trip to [place]. - Order bone density scan to be scheduled for before she leaves or after she returns from [place]

## 2023-02-19 NOTE — Assessment & Plan Note (Addendum)
Elevated blood pressure noted. Given age and potential risks of untreated hypertension, restarting medication is crucial to prevent complications such as stroke and heart disease. Starting on a lower dose to avoid side effects like dizziness and falls was discussed and agreed upon. - Start lisinopril 10 mg daily due to risk for falls in geriatric patient if overly aggressive, though I suspect she may need to increase back to original prescription of Lisinopril-hydrochlorothiazide 20-12.5 mg daily from 2018. - Follow up in three weeks to recheck blood pressure - Instruct family to monitor blood pressure at home - CMP and lipid panel ordered

## 2023-02-19 NOTE — Progress Notes (Signed)
New patient visit   Patient: Gwendolyn Medina   DOB: 04-Jan-1941   83 y.o. Female  MRN: [id number] Visit Date: 02/19/2023  Today's healthcare provider: Sherlyn Hay, DO   No chief complaint on file.  Subjective    Gwendolyn Medina is a 83 y.o. female who presents today as a new patient to establish care.  HPI  [facility] [place] Elissa Lovett interpreter   The patient is a 83 year old female who presents to establish care and address elevated blood pressure. She is accompanied by her son.  She has a history of hypertension and was previously on medication, though she is unsure why she stopped it. Currently, she is not taking any blood pressure medication. Her blood pressure was elevated today. She experiences occasional numbness in her legs, headaches, and dizziness. No chest pain, shortness of breath, nausea, vomiting, constipation, or diarrhea. No recent fatigue or abnormal tiredness.  She has not received any vaccines in the past six months, including the flu vaccine. She usually gets vaccinated when she feels sick, but not proactively. She has not received the COVID-19 vaccine recently and is unsure about the requirements for travel to [place].  She plans to travel to [place] next month to visit her other five children and is undecided about the length of her stay, which could range from three to six months. She lives with her son, who can assist in monitoring her blood pressure.   History reviewed. No pertinent past medical history. History reviewed. No pertinent surgical history. Family Status  Relation Name Status   Mother  Deceased   Father  Deceased  No partnership data on file   History reviewed. No pertinent family history. Social History   Socioeconomic History   Marital status: Widowed    Spouse name: Not on file   Number of children: Not on file   Years of education: Not on file   Highest education level: Not on file  Occupational History   Not on file   Tobacco Use   Smoking status: Never   Smokeless tobacco: Never  Vaping Use   Vaping status: Never Used  Substance and Sexual Activity   Alcohol use: No   Drug use: No   Sexual activity: Never  Other Topics Concern   Not on file  Social History Narrative   Not on file   Social Drivers of Health   Financial Resource Strain: Not on file  Food Insecurity: Not on file  Transportation Needs: Not on file  Physical Activity: Not on file  Stress: Not on file  Social Connections: Not on file   Outpatient Medications Prior to Visit  Medication Sig   [DISCONTINUED] aspirin EC 81 MG tablet Take 1 tablet (81 mg total) by mouth daily.   [DISCONTINUED] doxycycline (VIBRAMYCIN) 100 MG capsule Take 1 capsule (100 mg total) by mouth 2 (two) times daily.   [DISCONTINUED] lisinopril-hydrochlorothiazide (ZESTORETIC) 20-12.5 MG tablet Take 1 tablet by mouth daily. (Patient not taking: Reported on 06/11/2017)   No facility-administered medications prior to visit.   No Known Allergies  Immunization History  Administered Date(s) Administered   Pneumococcal Polysaccharide-23 05/13/2016   Tdap 05/13/2016    Health Maintenance  Topic Date Due   DEXA SCAN  Never done   Medicare Annual Wellness (AWV)  02/20/2023 (Originally 10-13-1940)   INFLUENZA VACCINE  04/21/2023 (Originally 08/22/2022)   Zoster Vaccines- Shingrix (1 of 2) 05/20/2023 (Originally 01/21/1991)   COVID-19 Vaccine (1 - 2024-25 season) 09/22/2023 (  Originally 09/22/2022)   Pneumonia Vaccine 98+ Years old (2 of 2 - PCV) 02/19/2024 (Originally 05/13/2017)   DTaP/Tdap/Td (2 - Td or Tdap) 05/14/2026   HPV VACCINES  Aged Out    Patient Care Team: Gwendolyn Medina, Gwendolyn Blitz, DO as PCP - General (Family Medicine)  Review of Systems  Constitutional:  Negative for appetite change, chills, fatigue and fever.  Respiratory:  Negative for chest tightness and shortness of breath.   Cardiovascular:  Negative for chest pain and palpitations.   Gastrointestinal:  Negative for abdominal pain, constipation, diarrhea, nausea and vomiting.  Musculoskeletal:  Positive for back pain (intermittent, mild).  Neurological:  Negative for dizziness, weakness and headaches.        Objective    BP (!) 173/90 (BP Location: Right Arm, Patient Position: Sitting, Cuff Size: Normal)   Pulse 79   Resp 18   Ht 4' 10.47" (1.485 m)   Wt 94 lb 9.6 oz (42.9 kg)   SpO2 98%   BMI 19.46 kg/m     Physical Exam Constitutional:      Appearance: Normal appearance.  HENT:     Head: Normocephalic and atraumatic.  Eyes:     General: No scleral icterus.    Extraocular Movements: Extraocular movements intact.     Conjunctiva/sclera: Conjunctivae normal.  Cardiovascular:     Rate and Rhythm: Normal rate and regular rhythm.     Pulses: Normal pulses.     Heart sounds: Normal heart sounds.  Pulmonary:     Effort: Pulmonary effort is normal. No respiratory distress.     Breath sounds: Normal breath sounds.  Musculoskeletal:     Right lower leg: No edema.     Left lower leg: No edema.  Skin:    General: Skin is warm and dry.  Neurological:     Mental Status: She is alert and oriented to person, place, and time. Mental status is at baseline.  Psychiatric:        Mood and Affect: Mood normal.        Behavior: Behavior normal.     Depression Screen    02/19/2023    2:16 PM 05/13/2016    3:35 PM  PHQ 2/9 Scores  PHQ - 2 Score 0 0  PHQ- 9 Score 0    No results found for any visits on 02/19/23.  Assessment & Plan     Establishing care with new doctor, encounter for Assessment & Plan: No recent vaccinations in the past two years. Discussed the importance of flu and pneumonia vaccines, especially given age and upcoming travel plans. Patient declined the COVID-19 vaccine but was advised on the benefits of the flu and pneumonia vaccines. Strongly recommended pneumonia vaccine due to increased susceptibility to pneumonia with age, which can lead  to hospitalization. - Administer flu vaccine - Strongly recommend pneumonia vaccine - Provide information on the importance of vaccinations for older adults   Essential hypertension Assessment & Plan: Elevated blood pressure noted. Given age and potential risks of untreated hypertension, restarting medication is crucial to prevent complications such as stroke and heart disease. Starting on a lower dose to avoid side effects like dizziness and falls was discussed and agreed upon. - Start lisinopril 10 mg daily due to risk for falls in geriatric patient if overly aggressive, though I suspect she may need to increase back to original prescription of Lisinopril-hydrochlorothiazide 20-12.5 mg daily from 2018. - Follow up in three weeks to recheck blood pressure - Instruct family to monitor blood  pressure at home - CMP and lipid panel ordered  Orders: -     Lisinopril; Take 1 tablet (10 mg total) by mouth daily.  Dispense: 30 tablet; Refill: 1 -     Comprehensive metabolic panel -     Lipid panel  Encounter for osteoporosis screening in asymptomatic postmenopausal patient Assessment & Plan: No history of bone density scan. Given age, assessing bone health is important to prevent fractures. Patient expressed interest in having the scan done after returning from a trip to [place]. - Order bone density scan to be scheduled for before she leaves or after she returns from [place]  Orders: -     DG Bone Density; Future  Chronic kidney disease, stage 2, mildly decreased GFR Assessment & Plan: Noted on prior blood work. No acute concerns. Will recheck levels today.  - uACR ordered   Orders: -     Microalbumin / creatinine urine ratio  Follow-up - Schedule follow-up appointment in three weeks to recheck blood pressure - Coordinate follow-up appointments with son's appointments for convenience.   Return in about 3 weeks (around 03/12/2023) for HTN; also need mAWV.     I discussed the  assessment and treatment plan with the patient  The patient was provided an opportunity to ask questions and all were answered. The patient agreed with the plan and demonstrated an understanding of the instructions.   The patient was advised to call back or seek an in-person evaluation if the symptoms worsen or if the condition fails to improve as anticipated.    Gwendolyn Hay, DO  [facility] [phone] (phone) [phone] (fax)  [facility]

## 2023-02-19 NOTE — Assessment & Plan Note (Signed)
No recent vaccinations in the past two years. Discussed the importance of flu and pneumonia vaccines, especially given age and upcoming travel plans. Patient declined the COVID-19 vaccine but was advised on the benefits of the flu and pneumonia vaccines. Strongly recommended pneumonia vaccine due to increased susceptibility to pneumonia with age, which can lead to hospitalization. - Administer flu vaccine - Strongly recommend pneumonia vaccine - Provide information on the importance of vaccinations for older adults

## 2023-02-19 NOTE — Assessment & Plan Note (Signed)
Noted on prior blood work. No acute concerns. Will recheck levels today.  - uACR ordered

## 2023-02-19 NOTE — Patient Instructions (Addendum)
Recommend:  - Covid booster - Shingrix (shingles vaccine) - Prevnar-20 or Prevnar-21 (pneumonia)   Check your blood pressure once daily, and any time you have concerning symptoms like headache, chest pain, dizziness, shortness of breath, or vision changes.   Our goal is less than 140/90.   To appropriately check your blood pressure, make sure you do the following:  1) Avoid caffeine, exercise, or tobacco products for 30 minutes before checking. Empty your bladder. 2) Sit with your back supported in a flat-backed chair. Rest your arm on something flat (arm of the chair, table, etc). 3) Sit still with your feet flat on the floor, resting, for at least 5 minutes.  4) Check your blood pressure. Take 1-2 readings.  5) Write down these readings and bring with you to any provider appointments.  Bring your home blood pressure machine with you to a provider's office for accuracy comparison at least once a year.   Make sure you take your blood pressure medications before you come to any office visit, even if you were asked to fast for labs.

## 2023-02-20 LAB — LIPID PANEL
Chol/HDL Ratio: 4 {ratio} (ref 0.0–4.4)
Cholesterol, Total: 177 mg/dL (ref 100–199)
HDL: 44 mg/dL (ref >39)
LDL Chol Calc (NIH): 112 mg/dL — ABNORMAL HIGH (ref 0–99)
Triglycerides: 116 mg/dL (ref 0–149)
VLDL Cholesterol Cal: 21 mg/dL (ref 5–40)

## 2023-02-20 LAB — COMPREHENSIVE METABOLIC PANEL
ALT: 6 [IU]/L (ref 0–32)
AST: 15 [IU]/L (ref 0–40)
Albumin: 4.3 g/dL (ref 3.7–4.7)
Alkaline Phosphatase: 90 [IU]/L (ref 44–121)
BUN/Creatinine Ratio: 18 (ref 12–28)
BUN: 17 mg/dL (ref 8–27)
Bilirubin Total: 0.3 mg/dL (ref 0.0–1.2)
CO2: 23 mmol/L (ref 20–29)
Calcium: 9.3 mg/dL (ref 8.7–10.3)
Chloride: 104 mmol/L (ref 96–106)
Creatinine, Ser: 0.96 mg/dL (ref 0.57–1.00)
Globulin, Total: 3 g/dL (ref 1.5–4.5)
Glucose: 109 mg/dL — ABNORMAL HIGH (ref 70–99)
Potassium: 4.4 mmol/L (ref 3.5–5.2)
Sodium: 142 mmol/L (ref 134–144)
Total Protein: 7.3 g/dL (ref 6.0–8.5)
eGFR: 59 mL/min/{1.73_m2} — ABNORMAL LOW (ref >59)

## 2023-02-20 LAB — MICROALBUMIN / CREATININE URINE RATIO
Creatinine, Urine: 233.7 mg/dL
Microalb/Creat Ratio: 19 mg/g{creat} (ref 0–29)
Microalbumin, Urine: 45.1 ug/mL

## 2023-02-24 ENCOUNTER — Encounter: Payer: Self-pay | Admitting: Family Medicine

## 2023-03-06 ENCOUNTER — Other Ambulatory Visit: Payer: Medicare Other

## 2023-03-12 ENCOUNTER — Ambulatory Visit: Payer: Self-pay | Admitting: Family Medicine

## 2023-04-07 ENCOUNTER — Ambulatory Visit: Payer: Medicare Other | Admitting: Family Medicine
